# Patient Record
Sex: Female | Born: 1960 | Race: Black or African American | Hispanic: No | Marital: Married | Smoking: Never smoker
Health system: Southern US, Community
[De-identification: ages and names within clinical notes are randomized; demographics above are authoritative.]

## PROBLEM LIST (undated history)

## (undated) DIAGNOSIS — I10 Essential (primary) hypertension: Secondary | ICD-10-CM

## (undated) HISTORY — PX: SINOSCOPY: SHX187

---

## 2016-12-18 HISTORY — PX: CERVICAL LAMINECTOMY: SHX94

## 2021-04-19 ENCOUNTER — Other Ambulatory Visit (HOSPITAL_BASED_OUTPATIENT_CLINIC_OR_DEPARTMENT_OTHER): Payer: Self-pay

## 2021-04-19 ENCOUNTER — Emergency Department (HOSPITAL_BASED_OUTPATIENT_CLINIC_OR_DEPARTMENT_OTHER): Payer: Commercial Managed Care - PPO

## 2021-04-19 ENCOUNTER — Encounter (HOSPITAL_BASED_OUTPATIENT_CLINIC_OR_DEPARTMENT_OTHER): Payer: Self-pay | Admitting: Emergency Medicine

## 2021-04-19 ENCOUNTER — Emergency Department (HOSPITAL_BASED_OUTPATIENT_CLINIC_OR_DEPARTMENT_OTHER)
Admission: EM | Admit: 2021-04-19 | Discharge: 2021-04-19 | Disposition: A | Discharge status: Home / Self Care | Payer: Commercial Managed Care - PPO | Attending: Emergency Medicine | Admitting: Emergency Medicine

## 2021-04-19 ENCOUNTER — Other Ambulatory Visit: Payer: Self-pay

## 2021-04-19 DIAGNOSIS — R200 Anesthesia of skin: Secondary | ICD-10-CM | POA: Diagnosis not present

## 2021-04-19 DIAGNOSIS — M5442 Lumbago with sciatica, left side: Secondary | ICD-10-CM

## 2021-04-19 DIAGNOSIS — R8290 Unspecified abnormal findings in urine: Secondary | ICD-10-CM | POA: Diagnosis not present

## 2021-04-19 DIAGNOSIS — I10 Essential (primary) hypertension: Secondary | ICD-10-CM | POA: Diagnosis not present

## 2021-04-19 HISTORY — DX: Essential (primary) hypertension: I10

## 2021-04-19 LAB — URINALYSIS, MICROSCOPIC (REFLEX)

## 2021-04-19 LAB — URINALYSIS, ROUTINE W REFLEX MICROSCOPIC
Bilirubin Urine: NEGATIVE
Glucose, UA: NEGATIVE mg/dL
Hgb urine dipstick: NEGATIVE
Ketones, ur: NEGATIVE mg/dL
Nitrite: NEGATIVE
Protein, ur: NEGATIVE mg/dL
Specific Gravity, Urine: 1.01 (ref 1.005–1.030)
pH: 8 (ref 5.0–8.0)

## 2021-04-19 MED ORDER — PREDNISONE 50 MG PO TABS
60.0000 mg | ORAL_TABLET | Freq: Once | ORAL | Status: AC
  Administered 2021-04-19: 60 mg via ORAL
  Filled 2021-04-19: qty 1

## 2021-04-19 MED ORDER — METHYLPREDNISOLONE 4 MG PO TBPK
ORAL_TABLET | ORAL | 0 refills | Status: DC
  Filled 2021-04-19: qty 21, 6d supply, fill #0

## 2021-04-19 MED ORDER — HYDROMORPHONE HCL 1 MG/ML IJ SOLN
1.0000 mg | Freq: Once | INTRAMUSCULAR | Status: AC
  Administered 2021-04-19: 1 mg via INTRAMUSCULAR
  Filled 2021-04-19: qty 1

## 2021-04-19 MED ORDER — CEPHALEXIN 500 MG PO CAPS
500.0000 mg | ORAL_CAPSULE | Freq: Four times a day (QID) | ORAL | 0 refills | Status: DC
  Filled 2021-04-19: qty 20, 5d supply, fill #0
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
## 2021-04-19 NOTE — ED Notes (Signed)
Patient recently moved to the area and unable to establish neurologist yet.

## 2021-04-19 NOTE — ED Provider Notes (Signed)
[facility] EMERGENCY DEPARTMENT Provider Note   CSN: [id number] Arrival date & time: 04/19/21  1054     History No chief complaint on file.   Alice Little is a 60 y.o. female with history of chronic back pain, chronic neck pain s/p cervical laminectomy presents to the ED for evaluation of low back pain that flared up 1 week ago.  Pain is daily but intermittent.  It is in the middle of her low back and it radiates to the left side.  It is worse with standing up and trying to walk.  Feels like her usual back pain but states typically her chronic pain medicines take the edge off and now they are not working.  She takes gabapentin, hydrocodone, Flexeril, meloxicam.  These medicines were prescribed to her by Dr. Vaughan Basta her pain doctor.  She recently moved to [place] from [place] and is establishing care here with primary care doctor.  She is awaiting referral to neurology for nerve conduction studies later this month.  She is also awaiting cervical spine injections.  Denies any increased activity, falls, prolonged sitting, heavy lifting lately.  Denies fever, chills.  No abdominal pain, dysuria, frequency.  No history of kidney stones. No CP, SOB. No saddle anesthesia, new weakness or numbness or paresthesias in her extremities.  Reports chronic numbness in the left first 3 fingers for a very long time and is awaiting nerve conduction studies for this.  Also reports when her back pain is severe if she stands up too long the top part of both of her thighs go "numb".  This is also been going on for a long time and is not new.  Has had similar flares of her back pain in the past and has responded to steroids. No IVDU.   HPI     Past Medical History:  Diagnosis Date  . Hypertension     There are no problems to display for this patient.   Past Surgical History:  Procedure Laterality Date  . CERVICAL LAMINECTOMY  2018     OB History   No obstetric history on file.     History  reviewed. No pertinent family history.  Social History   Tobacco Use  . Smoking status: Never Smoker  . Smokeless tobacco: Never Used  Vaping Use  . Vaping Use: Never used  Substance Use Topics  . Alcohol use: Not Currently  . Drug use: Not Currently    Home Medications Prior to Admission medications   Medication Sig Start Date End Date Taking? Authorizing Provider  cephALEXin (KEFLEX) 500 MG capsule Take 1 capsule (500 mg total) by mouth 4 (four) times daily. 04/19/21  Yes Liberty Handy, PA-C  methylPREDNISolone (MEDROL DOSEPAK) 4 MG TBPK tablet Take taper as prescribed, see back of pack for directions 04/19/21  Yes Liberty Handy, PA-C    Allergies    Patient has no allergy information on record.  Review of Systems   Review of Systems  Musculoskeletal: Positive for back pain and gait problem.  All other systems reviewed and are negative.   Physical Exam Updated Vital Signs BP 135/77   Pulse 72   Temp 99.9 F (37.7 C)   Resp (!) 22   Ht 5\' 6"  (1.676 m)   Wt 99.3 kg   LMP  (LMP Unknown)   SpO2 100%   BMI 35.35 kg/m   Physical Exam Vitals and nursing note reviewed.  Constitutional:      General: She is not  in acute distress.    Appearance: She is well-developed.     Comments: Looks uncomfortable.  HENT:     Head: Normocephalic and atraumatic.     Right Ear: External ear normal.     Left Ear: External ear normal.     Nose: Nose normal.  Eyes:     General: No scleral icterus.    Conjunctiva/sclera: Conjunctivae normal.  Cardiovascular:     Rate and Rhythm: Normal rate and regular rhythm.     Heart sounds: Normal heart sounds. No murmur heard.     Comments: 1+ radial and DP pulse bilaterally  Pulmonary:     Effort: Pulmonary effort is normal.     Breath sounds: Normal breath sounds. No wheezing.  Abdominal:     Palpations: Abdomen is soft.     Tenderness: There is abdominal tenderness.     Comments: Minimal left CVAT, mild left lateral abdominal  pain "sore", somewhat distractable. No G/R/R. No suprapubic tenderness. No rash.   Musculoskeletal:        General: Tenderness present. No deformity. Normal range of motion.     Cervical back: Normal range of motion and neck supple.     Comments: T spine: no midline or paraspinal tenderness. L spine: midline and left sided tenderness. TTP left SI joint. Pain with laying flat. +left SLR. +left Pearlean Brownie.   Pelvis: Pain with left hip flexion/IR/ER. Unable to hold left leg straight against resistance due to pain. No sciatic notch pain. No pelvis instability with APL compression.   Skin:    General: Skin is warm and dry.     Capillary Refill: Capillary refill takes less than 2 seconds.  Neurological:     Mental Status: She is alert and oriented to person, place, and time.     Comments:  Strength and sensation in bilateral lower extremities symmetric except patient unable to hold left leg straight against resistance due to pain. Strength at left knee, ankle 5/5.   Psychiatric:        Behavior: Behavior normal.        Thought Content: Thought content normal.        Judgment: Judgment normal.     ED Results / Procedures / Treatments   Labs (all labs ordered are listed, but only abnormal results are displayed) Labs Reviewed  URINALYSIS, ROUTINE W REFLEX MICROSCOPIC - Abnormal; Notable for the following components:      Result Value   Leukocytes,Ua MODERATE (*)    All other components within normal limits  URINALYSIS, MICROSCOPIC (REFLEX) - Abnormal; Notable for the following components:   Bacteria, UA FEW (*)    All other components within normal limits  URINE CULTURE    EKG None  Radiology DG Lumbar Spine Complete  Result Date: 04/19/2021 CLINICAL DATA:  Acute on chronic back pain. EXAM: LUMBAR SPINE - COMPLETE 4+ VIEW COMPARISON:  None. FINDINGS: Five lumbar type vertebral bodies. There is no evidence of lumbar spine fracture. Grade 1 degenerative L3 on L4 anterolisthesis. Multilevel  disc space narrowing and facet hypertrophy. Aortic atherosclerosis. IMPRESSION: No acute osseous abnormality.  Lumbar spondylosis. Electronically Signed   By: Maudry Mayhew MD   On: 04/19/2021 13:17    Procedures Procedures   Medications Ordered in ED Medications  predniSONE (DELTASONE) tablet 60 mg (60 mg Oral Given 04/19/21 1254)  HYDROmorphone (DILAUDID) injection 1 mg (1 mg Intramuscular Given 04/19/21 1255)    ED Course  I have reviewed the triage vital signs and the nursing  notes.  Pertinent labs & imaging results that were available during my care of the patient were reviewed by me and considered in my medical decision making (see chart for details).  Clinical Course as of 04/19/21 1516  Tue Apr 19, 2021  1323 DG Lumbar Spine Complete IMPRESSION: No acute osseous abnormality. Lumbar spondylosis. [CG]  1413 Glori Luis): MODERATE [CG]  1413 Bacteria, UA(!): FEW [CG]  1413 Squamous Epithelial / LPF: 0-5 [CG]    Clinical Course User Index [CG] Liberty Handy, PA-C   MDM Rules/Calculators/A&P                          60 year old female with history of chronic back pain, chronic neck pain presents to the ED for what sounds like acute on chronic low back pain for the last week.  Describes this pain similar to her usual chronic pain, in the same area but not responding to her usual pain regimen. Reports similar flares of her back pain in the past have felt similar and responded to steroids.  No red flags like recent fall, increased activity, saddle anesthesia, bladder or bowel incontinence or retention, new extremity weakness, abdominal pain, urinary symptoms, fevers.  No IV drug use.  On my exam patient has midline lumbar and left-sided paraspinal tenderness, positive left SLR, positive left Faber test.  No focal extremity weakness.  This is suggestive of MSK etiology.  She has no distal neuro/pulse deficits, CP, SOB.  Doubt dissection, AAA.  X-ray of the lumbar spine was done  today and it did not show any acute osseous abnormality.  Noted lumbar spondylosis mostly at level L3-4.  Reviewed patient's chart and she just had a x-ray of her lumbar spine 1 month ago that showed similar findings.  Will plan to discharge with medrol pack. She has prescribed flexeril, hydrocodone, meloxicam, gabapentin.    Of note, patient had left CVA and left mid abdominal tenderness that was mild on my exam.  No rash. She is denying any urinary tract infection symptoms.  She has no fever, nausea, vomiting, dysuria, urinary frequency, urinary urgency.  No suprapubic tenderness either.  She has no history of kidney stones.  She actually did not even bring up pain in her flank or abdomen but this was noted on my exam.  Urinalysis obtained today shows moderate leukocytes and few bacteria but no WBC, RBCs, nitrites.  Urine culture was sent.  Given left CVA tenderness, equivocal urinalysis discussed with patient we could either send urine for culture and await results or start antibiotics for possible UTI/pyelonephritis.  She does not have classic symptoms of UTI and suspect this is contaminant.  She feels more comfortable starting antibiotics for possible early UTI/pyelonephritis and await culture report.  I told her that pharmacy will contact her if her antibiotics need to be changed, stopped.  She will comfortable with this plan.  She understands that she needs to return to the ED immediately if she has any new urinary symptoms, sudden severe flank pain, hematuria that may suggest a kidney stone, fevers.  Again, she has no history of kidney stones and no RBCs in the urine.  I considered this less likely.  I do not think we need renal imaging today, blood work as she has no systemic symptoms.   Both her back pain and flank pain improved after medicines in ED.   Will discharge with medrol pack, keflex.  Return precautions discussed.    Final Clinical Impression(s) /  ED Diagnoses Final diagnoses:  Acute  left-sided low back pain with left-sided sciatica  Abnormal urinalysis    Rx / DC Orders ED Discharge Orders         Ordered    methylPREDNISolone (MEDROL DOSEPAK) 4 MG TBPK tablet        04/19/21 1458    cephALEXin (KEFLEX) 500 MG capsule  4 times daily        04/19/21 1458           Liberty HandyGibbons, Mirra Basilio J, PA-C 04/19/21 1516    Alvira MondaySchlossman, Erin, MD 04/21/21 [phone]

## 2021-04-19 NOTE — Discharge Instructions (Signed)
You were seen in the emergency department for worsening back pain  X-ray done today did not show any significant or new injuries  You had some left flank pain today on my exam.  We obtained a urinalysis which showed some evidence of a possible infection.  You did not have classic urinary tract infection symptoms but continued to have persistent left flank and left-sided abdominal tenderness.  Given this we will treat you with Keflex for possible/early urinary tract infection and kidney infection.  There was no blood in your urine and you have no history of kidney stones so this is less likely.  Your urine has been sent for culture.  The pharmacy team will follow up on the culture results in the next few days.  You will be contacted if your antibiotics need to be stopped, changed or modified.  Please take methylprednisone taper for your back pain.  This can help with inflammation and pain.  Return to the ED immediately if you have any urinary symptoms, fevers, chills, blood in your urine or worsening flank pain as the symptoms can suggest worsening infection or kidney stone  Return to the ED for new extremity weakness, worsening back pain, incontinence of your bladder or bowels, fevers   Please establish care with a primary pain specialist for ongoing management of your chronic back pain

## 2021-04-19 NOTE — ED Notes (Signed)
Pt to pharmacy to get rx

## 2021-04-20 LAB — URINE CULTURE: Culture: <10000 — AB

## 2021-07-05 ENCOUNTER — Other Ambulatory Visit: Payer: Self-pay

## 2021-07-05 ENCOUNTER — Emergency Department (HOSPITAL_COMMUNITY)
Admission: EM | Admit: 2021-07-05 | Discharge: 2021-07-05 | Disposition: A | Discharge status: Home / Self Care | Payer: Commercial Managed Care - PPO | Attending: Emergency Medicine | Admitting: Emergency Medicine

## 2021-07-05 ENCOUNTER — Emergency Department (HOSPITAL_COMMUNITY): Payer: Commercial Managed Care - PPO

## 2021-07-05 ENCOUNTER — Encounter (HOSPITAL_COMMUNITY): Payer: Self-pay | Admitting: Emergency Medicine

## 2021-07-05 DIAGNOSIS — I10 Essential (primary) hypertension: Secondary | ICD-10-CM | POA: Diagnosis not present

## 2021-07-05 DIAGNOSIS — R5381 Other malaise: Secondary | ICD-10-CM | POA: Insufficient documentation

## 2021-07-05 DIAGNOSIS — R0600 Dyspnea, unspecified: Secondary | ICD-10-CM | POA: Diagnosis not present

## 2021-07-05 DIAGNOSIS — R5383 Other fatigue: Secondary | ICD-10-CM | POA: Insufficient documentation

## 2021-07-05 DIAGNOSIS — R55 Syncope and collapse: Secondary | ICD-10-CM | POA: Insufficient documentation

## 2021-07-05 DIAGNOSIS — R531 Weakness: Secondary | ICD-10-CM | POA: Insufficient documentation

## 2021-07-05 DIAGNOSIS — R42 Dizziness and giddiness: Secondary | ICD-10-CM | POA: Insufficient documentation

## 2021-07-05 DIAGNOSIS — R110 Nausea: Secondary | ICD-10-CM | POA: Insufficient documentation

## 2021-07-05 LAB — BASIC METABOLIC PANEL
Anion gap: 14 (ref 5–15)
BUN: 20 mg/dL (ref 6–20)
CO2: 26 mmol/L (ref 22–32)
Calcium: 10.1 mg/dL (ref 8.9–10.3)
Chloride: 99 mmol/L (ref 98–111)
Creatinine, Ser: 1.2 mg/dL — ABNORMAL HIGH (ref 0.44–1.00)
GFR, Estimated: 52 mL/min — ABNORMAL LOW (ref >60)
Glucose, Bld: 119 mg/dL — ABNORMAL HIGH (ref 70–99)
Potassium: 4.6 mmol/L (ref 3.5–5.1)
Sodium: 139 mmol/L (ref 135–145)

## 2021-07-05 LAB — CBC
HCT: 41.9 % (ref 36.0–46.0)
Hemoglobin: 13.4 g/dL (ref 12.0–15.0)
MCH: 25.9 pg — ABNORMAL LOW (ref 26.0–34.0)
MCHC: 32 g/dL (ref 30.0–36.0)
MCV: 80.9 fL (ref 80.0–100.0)
Platelets: 286 10*3/uL (ref 150–400)
RBC: 5.18 MIL/uL — ABNORMAL HIGH (ref 3.87–5.11)
RDW: 15.7 % — ABNORMAL HIGH (ref 11.5–15.5)
WBC: 7.7 10*3/uL (ref 4.0–10.5)
nRBC: 0 % (ref 0.0–0.2)

## 2021-07-05 LAB — TROPONIN I (HIGH SENSITIVITY): Troponin I (High Sensitivity): <2 ng/L (ref <18)

## 2021-07-05 NOTE — ED Provider Notes (Signed)
[facility]-EMERGENCY DEPT [facility] Emergency Department Provider Note MRN:  [id number]  Arrival date & time: 07/05/21     Chief Complaint   Loss of Consciousness, Weakness, Numbness, and Dizziness   History of Present Illness   Alice Little is a 60 y.o. year-old female with a history of hypertension presenting to the ED with chief complaint of loss of consciousness.  Patient took a hot shower and then was walking to her bed and began feeling malaise, fatigue, nausea, lightheadedness.  Tried to lay down but then felt like she was having trouble breathing.  She stood up and went to use the bathroom and then stood up quickly and then she felt even more lightheaded and passed out.  Has been feeling generally weak and unwell since but slowly feeling better.  Denies ever having any chest pain, no recent fever or illness, no numbness or weakness in the arms or legs.  Symptoms sudden, constant, no other exacerbating or alleviating factors.  Review of Systems  A complete 10 system review of systems was obtained and all systems are negative except as noted in the HPI and PMH.   Patient's Health History    Past Medical History:  Diagnosis Date   Hypertension     Past Surgical History:  Procedure Laterality Date   CERVICAL LAMINECTOMY  2018    No family history on file.  Social History   Socioeconomic History   Marital status: Married    Spouse name: Not on file   Number of children: Not on file   Years of education: Not on file   Highest education level: Not on file  Occupational History   Not on file  Tobacco Use   Smoking status: Never   Smokeless tobacco: Never  Vaping Use   Vaping Use: Never used  Substance and Sexual Activity   Alcohol use: Not Currently   Drug use: Not Currently   Sexual activity: Not Currently  Other Topics Concern   Not on file  Social History Narrative   Not on file   Social Determinants of Health   Financial Resource Strain: Not on file  Food  Insecurity: Not on file  Transportation Needs: Not on file  Physical Activity: Not on file  Stress: Not on file  Social Connections: Not on file  Intimate Partner Violence: Not on file     Physical Exam   Vitals:   07/05/21 0430 07/05/21 0543  BP: 131/73 130/70  Pulse: 65 70  Resp: 16 14  Temp:    SpO2: 96% 95%    CONSTITUTIONAL: Well-appearing, NAD NEURO:  Alert and oriented x 3, normal and symmetric strength and sensation, normal coordination, normal speech EYES:  eyes equal and reactive ENT/NECK:  no LAD, no JVD CARDIO: Regular rate, well-perfused, normal S1 and S2 PULM:  CTAB no wheezing or rhonchi GI/GU:  normal bowel sounds, non-distended, non-tender MSK/SPINE:  No gross deformities, no edema SKIN:  no rash, atraumatic PSYCH:  Appropriate speech and behavior  *Additional and/or pertinent findings included in MDM below  Diagnostic and Interventional Summary    EKG Interpretation  Date/Time:  Tuesday July 05 2021 03:38:05 EDT Ventricular Rate:  57 PR Interval:  196 QRS Duration: 108 QT Interval:  442 QTC Calculation: 431 R Axis:   4 Text Interpretation: Sinus rhythm Low voltage, precordial leads Confirmed by Kennis Carina [phone]) on 07/05/2021 5:00:08 AM       Labs Reviewed  CBC - Abnormal; Notable for the following components:  Result Value   RBC 5.18 (*)    MCH 25.9 (*)    RDW 15.7 (*)    All other components within normal limits  BASIC METABOLIC PANEL - Abnormal; Notable for the following components:   Glucose, Bld 119 (*)    Creatinine, Ser 1.20 (*)    GFR, Estimated 52 (*)    All other components within normal limits  TROPONIN I (HIGH SENSITIVITY)    DG Chest Port 1 View  Final Result      Medications - No data to display   Procedures  /  Critical Care .1-3 Lead EKG Interpretation  Date/Time: 07/05/2021 5:44 AM Performed by: Sabas Sous, MD Authorized by: Sabas Sous, MD    ED Course and Medical Decision Making  I have  reviewed the triage vital signs, the nursing notes, and pertinent available records from the EMR.  Listed above are laboratory and imaging tests that I personally ordered, reviewed, and interpreted and then considered in my medical decision making (see below for details).  Suspect benign syncopal event triggered by either the hot shower or using the bathroom or standing up quickly.  No chest pain but is endorsing some transient shortness of breath at the time, I suspect related to the symptoms and feeling unwell but will obtain screening tests with EKG, troponin, chest x-ray.  Reassuring neurological exam, no evidence of DVT, highly doubt PE.     Work-up reassuring, appropriate for discharge.  Elmer Sow. Pilar Plate, MD [facility] Emergency Medicine [facility] [email or web address]  Final Clinical Impressions(s) / ED Diagnoses     ICD-10-CM   1. Syncope, unspecified syncope type  R55       ED Discharge Orders     None        Discharge Instructions Discussed with and Provided to Patient:     Discharge Instructions      You were evaluated in the Emergency Department and after careful evaluation, we did not find any emergent condition requiring admission or further testing in the hospital.  Your exam/testing today was overall reassuring.  Please return to the Emergency Department if you experience any worsening of your condition.  Thank you for allowing [place] to be a part of your care.         Sabas Sous, MD 07/05/21 [phone]

## 2021-07-05 NOTE — ED Triage Notes (Signed)
Patient arrives complaining of dizziness after showering tonight at 0000. Patient states that she began having leg numbness and weakness and had a + LOC, abrasions noted to knees. Patient states she woke up on the floor. After waking up, patient states when she went to get up, she began vomiting.

## 2021-07-05 NOTE — Discharge Instructions (Addendum)
You were evaluated in the Emergency Department and after careful evaluation, we did not find any emergent condition requiring admission or further testing in the hospital.  Your exam/testing today was overall reassuring.  Please return to the Emergency Department if you experience any worsening of your condition.  Thank you for allowing us to be a part of your care.  

## 2022-01-28 IMAGING — CR DG LUMBAR SPINE COMPLETE 4+V
4 series · 4 of 4 positions shown · non-contrast
Comparison: None.

CLINICAL DATA: Acute on chronic back pain.

EXAM:
LUMBAR SPINE - COMPLETE 4+ VIEW

[t l-spine a.p.]
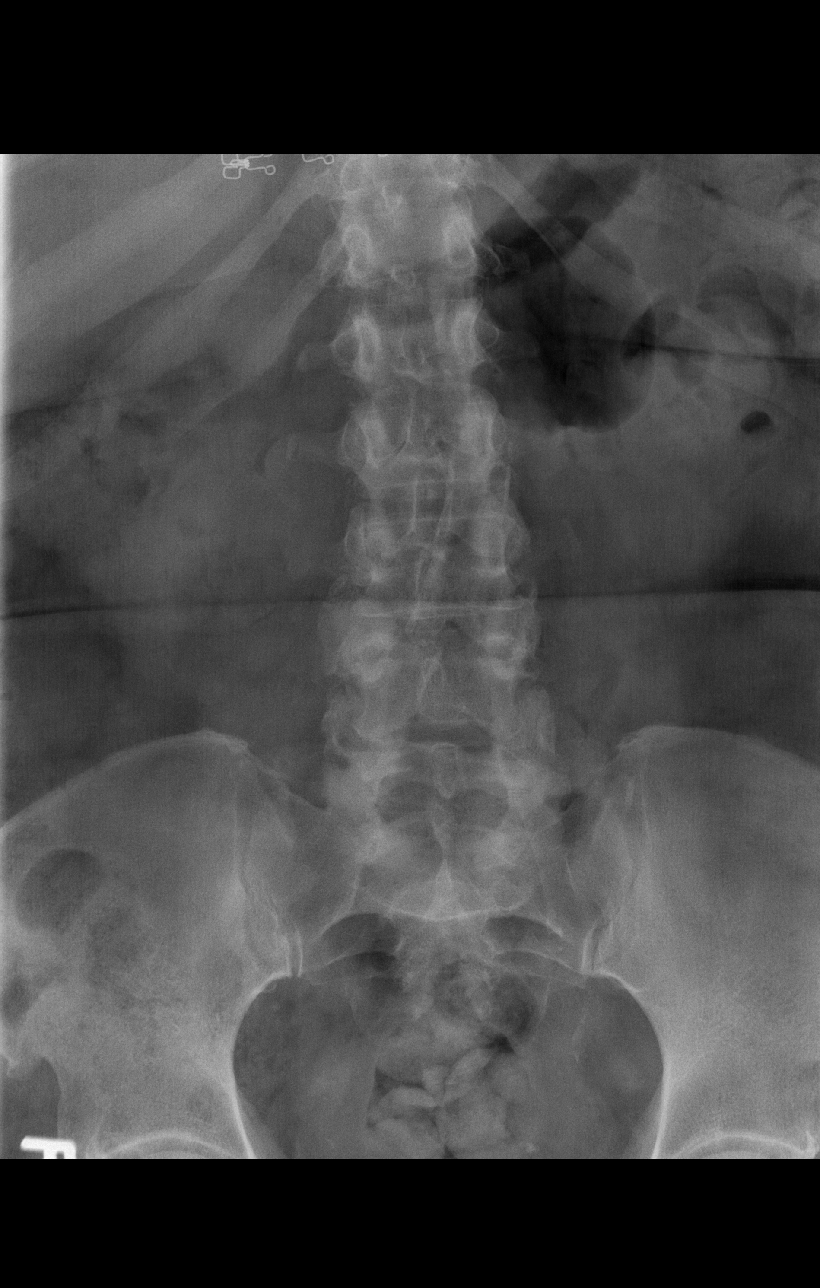

[t l-spine oblique exposure *]
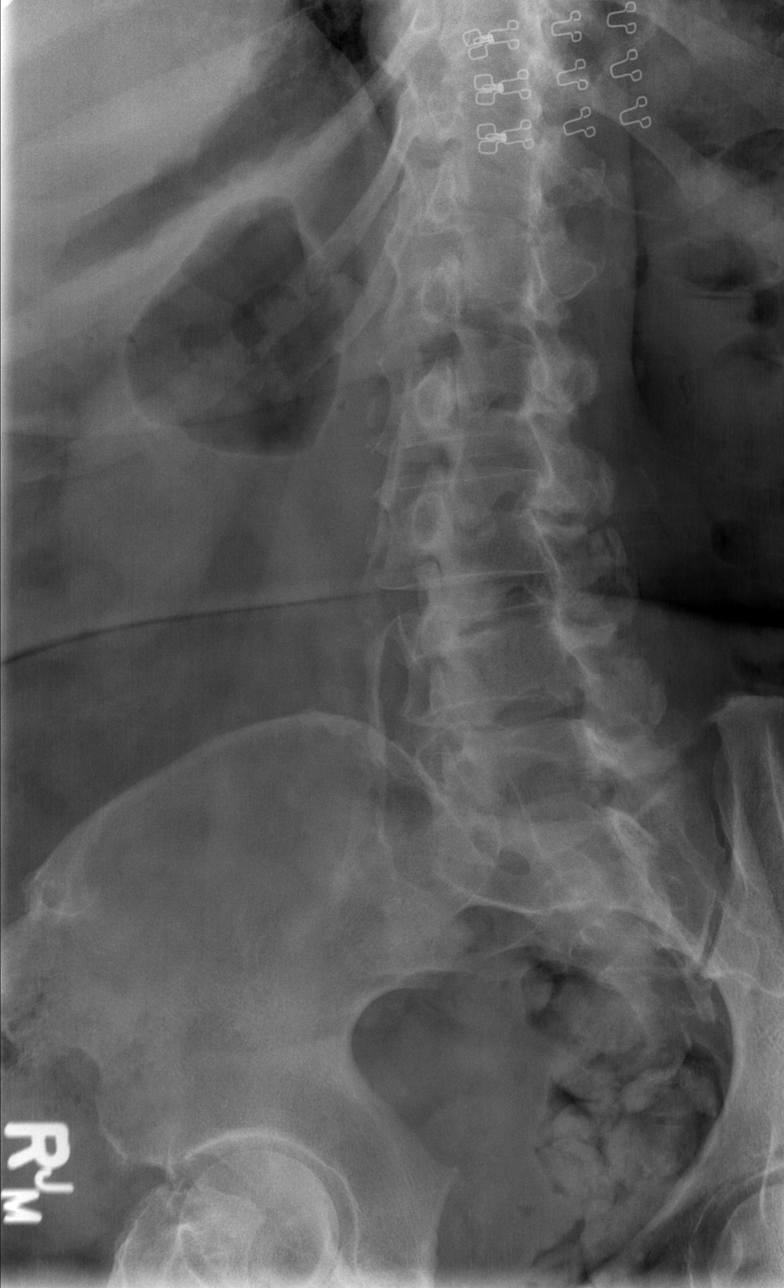

[t l-spine lat]
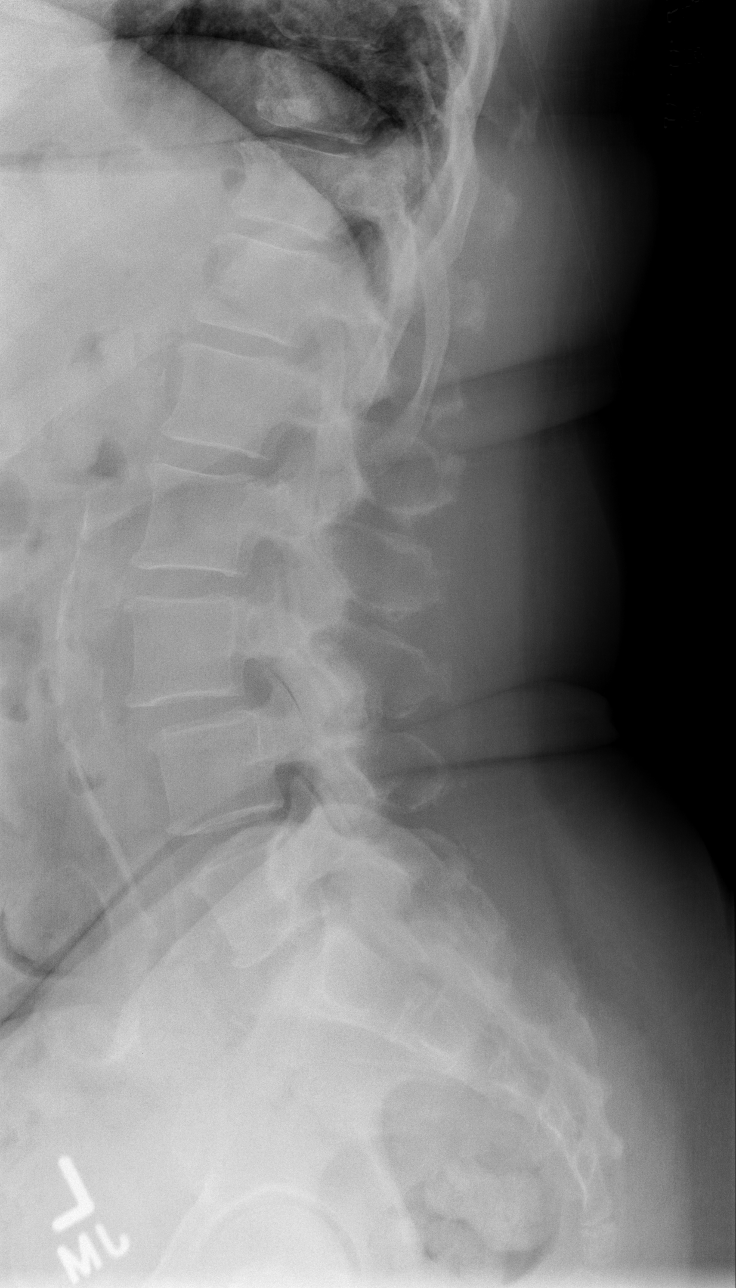

[t l-spine l5-s1 spot]
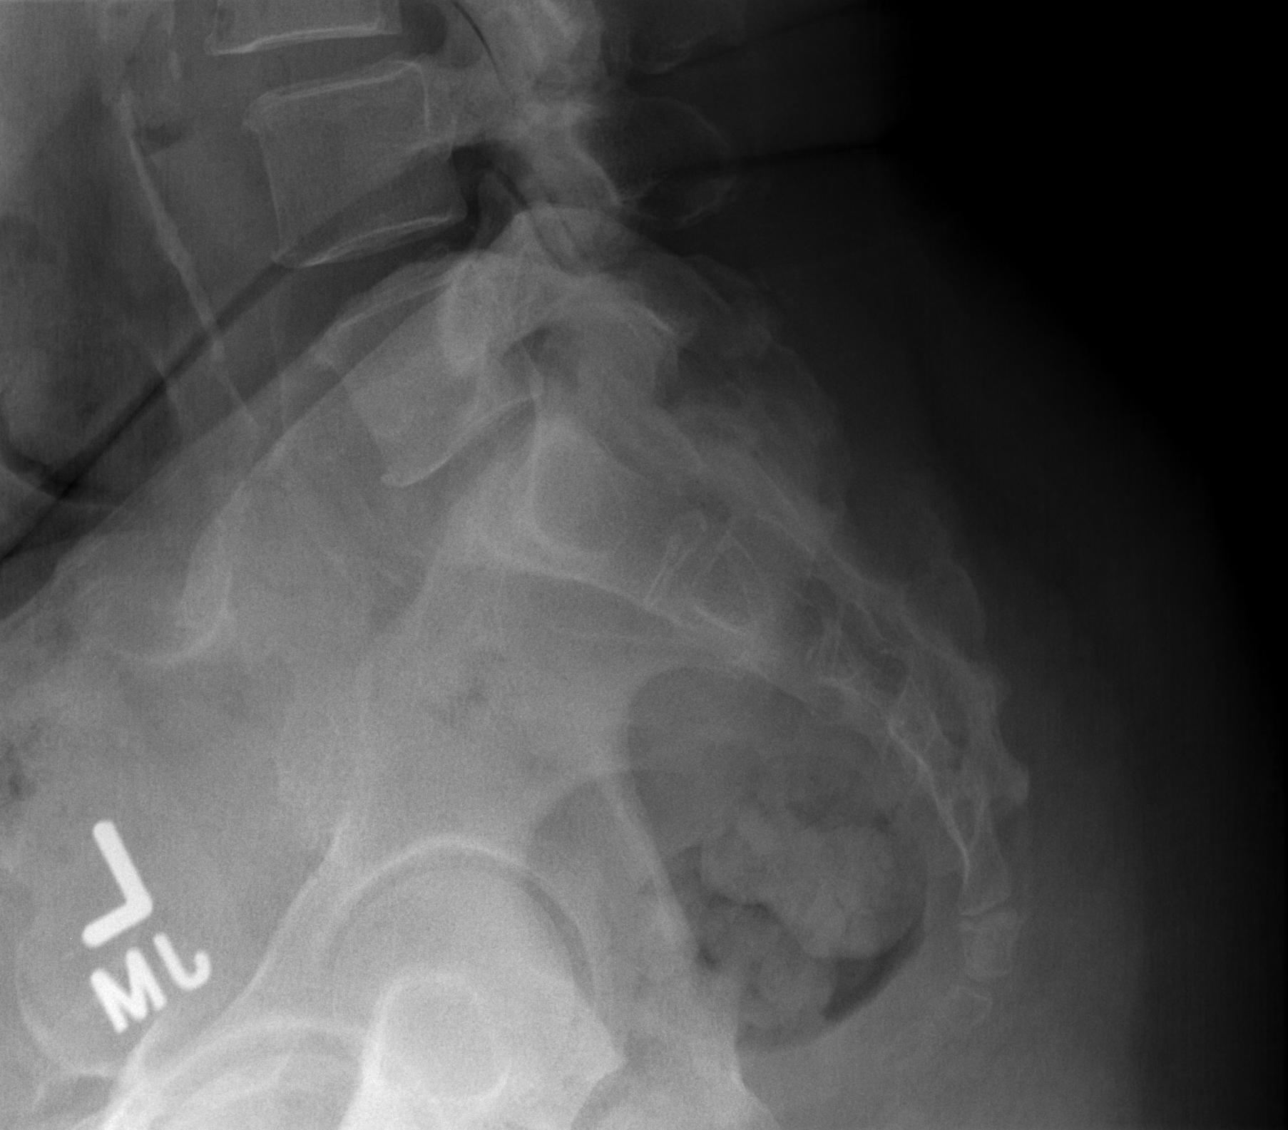

[4 of 4 positions shown; findings below may reference images not displayed]

FINDINGS: Five lumbar type vertebral bodies. There is no evidence of lumbar
spine fracture. Grade 1 degenerative L3 on L4 anterolisthesis.
Multilevel disc space narrowing and facet hypertrophy. Aortic
atherosclerosis.
IMPRESSION: No acute osseous abnormality.  Lumbar spondylosis.

## 2022-03-22 LAB — COLOGUARD: COLOGUARD: NEGATIVE

## 2022-03-22 LAB — EXTERNAL GENERIC LAB PROCEDURE: COLOGUARD: NEGATIVE

## 2023-02-09 ENCOUNTER — Other Ambulatory Visit: Payer: Self-pay

## 2023-02-09 DIAGNOSIS — M4802 Spinal stenosis, cervical region: Secondary | ICD-10-CM

## 2023-02-12 ENCOUNTER — Other Ambulatory Visit: Payer: Self-pay | Admitting: Neurosurgery

## 2023-02-12 DIAGNOSIS — M4802 Spinal stenosis, cervical region: Secondary | ICD-10-CM

## 2023-02-16 ENCOUNTER — Ambulatory Visit
Admission: RE | Admit: 2023-02-16 | Discharge: 2023-02-16 | Disposition: A | Discharge status: Home / Self Care | Payer: Commercial Managed Care - PPO | Source: Ambulatory Visit | Attending: Neurosurgery | Admitting: Neurosurgery

## 2023-02-16 DIAGNOSIS — M4802 Spinal stenosis, cervical region: Secondary | ICD-10-CM

## 2024-07-02 ENCOUNTER — Other Ambulatory Visit: Payer: Self-pay

## 2024-07-02 ENCOUNTER — Encounter: Payer: Self-pay | Admitting: Allergy

## 2024-07-02 ENCOUNTER — Ambulatory Visit: Admitting: Allergy

## 2024-07-02 VITALS — BP 140/82 | HR 72 | Temp 97.9°F | Resp 18 | Ht 66.0 in | Wt 214.3 lb

## 2024-07-02 DIAGNOSIS — J3089 Other allergic rhinitis: Secondary | ICD-10-CM | POA: Diagnosis not present

## 2024-07-02 DIAGNOSIS — R053 Chronic cough: Secondary | ICD-10-CM

## 2024-07-02 NOTE — Progress Notes (Signed)
 New Patient Note  RE: Alice Little MRN: [id number] DOB: 11/13/1961 Date of Office Visit: 07/02/2024  Consult requested by: Alice Little Primary care provider: Valma Lannie DELENA, PA-C  Chief Complaint: Establish Care (Patient is having seasonal allergies and medication is not helping. Patients wants to know what she is exactly to allergic to.  Eyes start to water, stuffy nose and a bad cough are the symptoms she has when a bad flare up happens. )  History of Present Illness: I had the pleasure of seeing Alice Little for initial evaluation at the [facility] on 07/02/2024. She is a 63 y.o. female, who is referred here by Alice Lannie DELENA, PA-C for the evaluation of allergies.  Discussed the use of AI scribe software for clinical note transcription with the patient, who gave verbal consent to proceed.    She has experienced nasal congestion, cough, itchy eyes, throat itching, sneezing, and runny nose for several years, with symptoms worsening during seasonal changes, particularly after March and during the summer. She underwent sinus surgery around 2005 and has been diagnosed with allergic rhinitis, postnasal drip, and vasomotor rhinitis by different doctors.  Her current medications include cetirizine, montelukast, azelastine nasal spray (1-2 sprays per nostril, frequency varies), and over-the-counter eye drops. She previously used Flonase, which was discontinued because it seemed like it wasn't helping, and she also used ipratropium nasal spray in the past but does not recall the reason for discontinuation. She also uses an inhaler for breathing issues, though she hasn't used it in over a month and is unsure of its effectiveness.  She underwent allergy testing in [place]  around 2013, which did not reveal any specific allergies, and she has not been on allergy shots. No recent sinus infections, fevers, chills, or significant changes in eating or bathroom habits.  Her  medical history includes high cholesterol and high blood pressure, for which she takes medication. She also takes Lyrica for back and neck pain. No known allergies to medications, foods, or insect stings. No recent sinus infections, fevers, chills, heartburn, reflux, asthma, frequent illnesses, or antibiotic use. Occasional breathing issues.     She reports symptoms of nasal congestion, coughing, itchy/watery eyes, throat itching, sneezing, rhinorrhea. Symptoms have been going on for 20+ years. The symptoms are present all year around with worsening in spring and change of seasons. Anosmia: yes sometimes. Headache: no. She has used zyrtec, allegra, Singulair, azelastine 1-2 sprays BID prn with minimal improvement in symptoms. Sinus infections: no. Previous work up includes: skin testing in [place] in 2013 which was negative. No prior AIT. Previous ENT evaluation: patient has sinus surgery in 2005. Previous sinus imaging: no. History of nasal polyps: no. Last eye exam: 2025. History of reflux: denies.  Assessment and Plan: Alice Little is a 63 y.o. female with: Rhinitis Chronic nasal congestion, sneezing, rhinorrhea, and pruritus exacerbated by seasonal changes. Previous negative allergy testing. Has sinus surgery in the past. Current medications ineffective. Differential includes allergic vs nonallergic or vasomotor rhinitis. Return for allergy skin testing. Will make additional recommendations based on results. If negative will refer to ENT next. There's cryotherapy for vasomotor rhinitis which can help with this. Usually done by ENT.  Continue Singulair (montelukast) 10mg  daily at night. Use over the counter antihistamines such as Zyrtec (cetirizine), Claritin (loratadine), Allegra (fexofenadine), or Xyzal (levocetirizine) daily as needed. May take twice a day during allergy flares. May switch antihistamines every few months. Hold 3 days before skin testing.  Use azelastine  nasal spray 1-2 sprays per  nostril twice a day as needed for runny nose/drainage. Hold 3 days before skin testing.  Nasal saline spray (i.e., Simply Saline) or nasal saline lavage (i.e., NeilMed) is recommended as needed and prior to medicated nasal sprays.  Chronic cough No prior asthma diagnosis but has albuterol inhaler which she uses less than once per month with no real benefit. Today's spirometry was normal. May use albuterol rescue inhaler 2 puffs every 4 to 6 hours as needed for shortness of breath, chest tightness, coughing, and wheezing. Monitor frequency of use - if you need to use it more than twice per week on a consistent basis let us  know.   Return for Skin testing.  No orders of the defined types were placed in this encounter.  Lab Orders  No laboratory test(s) ordered today    Other allergy screening: Asthma:  Was prescribed an inhaler and not sure if the inhaler helps.  Food allergy: no Medication allergy: no Hymenoptera allergy: no Urticaria: no Eczema:no History of recurrent infections suggestive of immunodeficency: no  Diagnostics: Spirometry:  Tracings reviewed. Her effort: Good reproducible efforts. FVC: 2.12L FEV1: 1.87L, 83% predicted FEV1/FVC ratio: 88% Interpretation: Spirometry consistent with normal pattern.  Please see scanned spirometry results for details.  Results discussed with patient/family.   Past Medical History: There are no active problems to display for this patient.  Past Medical History:  Diagnosis Date   Hypertension    Past Surgical History: Past Surgical History:  Procedure Laterality Date   CERVICAL LAMINECTOMY  2018   SINOSCOPY     Medication List:  Current Outpatient Medications  Medication Sig Dispense Refill   carvedilol (COREG) 12.5 MG tablet Take 12.5 mg by mouth 2 (two) times daily.     cyclobenzaprine (FLEXERIL) 10 MG tablet Take 10 mg by mouth 2 (two) times daily as needed.     diltiazem (CARDIZEM SR) 60 MG 12 hr capsule Take 2  capsules by mouth in the morning and at bedtime.     hydrochlorothiazide (HYDRODIURIL) 25 MG tablet Take 1 tablet by mouth daily.     meloxicam (MOBIC) 7.5 MG tablet Take 7.5 mg by mouth daily.     olmesartan (BENICAR) 40 MG tablet Take 40 mg by mouth daily.     rosuvastatin (CRESTOR) 5 MG tablet Take 5 mg by mouth daily.     No current facility-administered medications for this visit.   Allergies: No Known Allergies Social History: Social History   Socioeconomic History   Marital status: Married    Spouse name: Not on file   Number of children: Not on file   Years of education: Not on file   Highest education level: Not on file  Occupational History   Not on file  Tobacco Use   Smoking status: Never   Smokeless tobacco: Never  Vaping Use   Vaping status: Never Used  Substance and Sexual Activity   Alcohol use: Not Currently   Drug use: Not Currently   Sexual activity: Not Currently  Other Topics Concern   Not on file  Social History Narrative   Not on file   Social Drivers of Health   Financial Resource Strain: High Risk (04/02/2024)   Received from [facility]   Overall Financial Resource Strain (CARDIA)    Difficulty of Paying Living Expenses: Hard  Food Insecurity: Food Insecurity Present (04/02/2024)   Received from [facility]   Hunger Vital Sign    Within the past 12 months,  you worried that your food would run out before you got the money to buy more.: Sometimes true    Within the past 12 months, the food you bought just didn't last and you didn't have money to get more.: Sometimes true  Transportation Needs: No Transportation Needs (04/02/2024)   Received from [facility]   PRAPARE - Transportation    Lack of Transportation (Medical): No    Lack of Transportation (Non-Medical): No  Physical Activity: Unknown (04/02/2024)   Received from [facility]   Exercise Vital Sign    On average, how many days per week do you engage in moderate to strenuous  exercise (like a brisk walk)?: 0 days    Minutes of Exercise per Session: Not on file  Stress: Stress Concern Present (04/02/2024)   Received from [facility] - Occupational Stress Questionnaire    Feeling of Stress : To some extent  Social Connections: Somewhat Isolated (04/02/2024)   Received from [facility]   Social Network    How would you rate your social network (family, work, friends)?: Restricted participation with some degree of social isolation   Lives in an apartment. Smoking: smoked from 1975-2001 Occupation: Soil scientist History: Water Damage/mildew in the house: no Engineer, civil (consulting) in the family room: yes Carpet in the bedroom: yes Heating: electric Cooling: central Pet: no  Family History: Family History  Problem Relation Age of Onset   Asthma Mother    Review of Systems  Constitutional:  Negative for appetite change, chills, fever and unexpected weight change.  HENT:  Positive for congestion, rhinorrhea and sneezing.   Eyes:  Positive for itching.  Respiratory:  Positive for cough. Negative for chest tightness, shortness of breath and wheezing.   Cardiovascular:  Negative for chest pain.  Gastrointestinal:  Negative for abdominal pain.  Genitourinary:  Negative for difficulty urinating.  Skin:  Positive for rash.  Neurological:  Negative for headaches.    Objective: BP (!) 140/82 (BP Location: Right Arm, Patient Position: Sitting, Cuff Size: Normal)   Pulse 72   Temp 97.9 F (36.6 C) (Temporal)   Resp 18   Ht 5' 6 (1.676 m)   Wt 214 lb 4.8 oz (97.2 kg)   LMP  (LMP Unknown)   SpO2 96%   BMI 34.59 kg/m  Body mass index is 34.59 kg/m. Physical Exam Vitals and nursing note reviewed.  Constitutional:      Appearance: Normal appearance. She is well-developed.  HENT:     Head: Normocephalic and atraumatic.     Right Ear: Tympanic membrane and external ear normal.     Left Ear: Tympanic  membrane and external ear normal.     Nose: Nose normal.     Mouth/Throat:     Mouth: Mucous membranes are moist.     Pharynx: Oropharynx is clear.  Eyes:     Conjunctiva/sclera: Conjunctivae normal.  Cardiovascular:     Rate and Rhythm: Normal rate and regular rhythm.     Heart sounds: Normal heart sounds. No murmur heard.    No friction rub. No gallop.  Pulmonary:     Effort: Pulmonary effort is normal.     Breath sounds: Normal breath sounds. No wheezing, rhonchi or rales.  Musculoskeletal:     Cervical back: Neck supple.  Skin:    General: Skin is warm.     Findings: No rash.  Neurological:     Mental Status: She is alert and oriented to  person, place, and time.  Psychiatric:        Behavior: Behavior normal.    The plan was reviewed with the patient/family, and all questions/concerned were addressed.  It was my pleasure to see Alice Little today and participate in her care. Please feel free to contact me with any questions or concerns.  Sincerely,  Orlan Cramp, DO Allergy & Immunology  [facility]  [facility] office: [phone] [facility] office: [phone]

## 2024-07-02 NOTE — Patient Instructions (Addendum)
 Rhinitis  Return for allergy skin testing. Will make additional recommendations based on results. If negative will refer to ENT next. There's cryotherapy for vasomotor rhinitis which can help with this. Usually done by ENT.  Make sure you don't take any antihistamines for 3 days before the skin testing appointment. Don't put any lotion on the back and arms on the day of testing.  Must be in good health and not ill. No vaccines/injections/antibiotics within the past 7 days.  Plan on being here for 30-60 minutes.  Continue Singulair (montelukast) 10mg  daily at night. Use over the counter antihistamines such as Zyrtec (cetirizine), Claritin (loratadine), Allegra (fexofenadine), or Xyzal (levocetirizine) daily as needed. May take twice a day during allergy flares. May switch antihistamines every few months. Hold 3 days before skin testing.  Use azelastine nasal spray 1-2 sprays per nostril twice a day as needed for runny nose/drainage. Hold 3 days before skin testing.  Nasal saline spray (i.e., Simply Saline) or nasal saline lavage (i.e., NeilMed) is recommended as needed and prior to medicated nasal sprays.  Breathing Normal breathing test today. May use albuterol rescue inhaler 2 puffs every 4 to 6 hours as needed for shortness of breath, chest tightness, coughing, and wheezing. Monitor frequency of use - if you need to use it more than twice per week on a consistent basis let us  know.   Follow up for skin testing.

## 2024-07-18 ENCOUNTER — Ambulatory Visit: Admitting: Allergy

## 2024-07-18 NOTE — Progress Notes (Deleted)
   Skin testing note  RE: Panagiota Perfetti MRN: [id number] DOB: Mar 02, 1961 Date of Office Visit: 07/18/2024  Referring provider: Valma Lannie DELENA DEVONNA Primary care provider: Valma Lannie DELENA, PA-C  Chief Complaint: skin testing  History of Present Illness: I had the pleasure of seeing Lacheryl Niesen for a skin testing visit at the [facility] on 07/18/2024. She is a 63 y.o. female, who is being followed for allergic rhinitis and chronic cough. Her previous allergy office visit was on 07/02/2024 with Dr. Luke. Today is a skin testing visit.   Discussed the use of AI scribe software for clinical note transcription with the patient, who gave verbal consent to proceed.  History of Present Illness             *** Assessment and Plan: Andrell is a 63 y.o. female with: Rhinitis Chronic nasal congestion, sneezing, rhinorrhea, and pruritus exacerbated by seasonal changes. Previous negative allergy testing. Has sinus surgery in the past. Current medications ineffective. Differential includes allergic vs nonallergic or vasomotor rhinitis. Return for allergy skin testing. Will make additional recommendations based on results. If negative will refer to ENT next. There's cryotherapy for vasomotor rhinitis which can help with this. Usually done by ENT.  Continue Singulair (montelukast) 10mg  daily at night. Use over the counter antihistamines such as Zyrtec (cetirizine), Claritin (loratadine), Allegra (fexofenadine), or Xyzal (levocetirizine) daily as needed. May take twice a day during allergy flares. May switch antihistamines every few months. Hold 3 days before skin testing.  Use azelastine nasal spray 1-2 sprays per nostril twice a day as needed for runny nose/drainage. Hold 3 days before skin testing.  Nasal saline spray (i.e., Simply Saline) or nasal saline lavage (i.e., NeilMed) is recommended as needed and prior to medicated nasal sprays.   Chronic cough No prior asthma diagnosis  but has albuterol inhaler which she uses less than once per month with no real benefit. Today's spirometry was normal. May use albuterol rescue inhaler 2 puffs every 4 to 6 hours as needed for shortness of breath, chest tightness, coughing, and wheezing. Monitor frequency of use - if you need to use it more than twice per week on a consistent basis let us  know.   Assessment and Plan              No follow-ups on file.  No orders of the defined types were placed in this encounter.  Lab Orders  No laboratory test(s) ordered today    Diagnostics: Skin Testing: Environmental allergy panel. *** Results discussed with patient/family.   Previous notes and tests were reviewed. The plan was reviewed with the patient/family, and all questions/concerned were addressed.  It was my pleasure to see Tammra today and participate in her care. Please feel free to contact me with any questions or concerns.  Sincerely,  Orlan Luke, DO Allergy & Immunology  [facility]  [facility] office: [phone] [facility] office: [phone]
